# Patient Record
Sex: Male | Born: 1960 | Race: White | Hispanic: No | Marital: Single | Smoking: Current every day smoker
Health system: Southern US, Community
[De-identification: ages and names within clinical notes are randomized; demographics above are authoritative.]

## PROBLEM LIST (undated history)

## (undated) DIAGNOSIS — I10 Essential (primary) hypertension: Secondary | ICD-10-CM

## (undated) HISTORY — PX: HAND SURGERY: SHX662

## (undated) HISTORY — PX: SHOULDER SURGERY: SHX246

## (undated) HISTORY — PX: HERNIA REPAIR: SHX51

---

## 2002-05-21 ENCOUNTER — Emergency Department (HOSPITAL_COMMUNITY): Admission: EM | Admit: 2002-05-21 | Discharge: 2002-05-21 | Payer: Self-pay | Admitting: Emergency Medicine

## 2008-10-19 ENCOUNTER — Emergency Department (HOSPITAL_COMMUNITY): Admission: EM | Admit: 2008-10-19 | Discharge: 2008-10-19 | Payer: Self-pay | Admitting: Emergency Medicine

## 2008-10-28 ENCOUNTER — Ambulatory Visit (HOSPITAL_BASED_OUTPATIENT_CLINIC_OR_DEPARTMENT_OTHER): Admission: RE | Admit: 2008-10-28 | Discharge: 2008-10-28 | Payer: Self-pay | Admitting: Orthopedic Surgery

## 2009-09-14 ENCOUNTER — Emergency Department (HOSPITAL_COMMUNITY): Admission: EM | Admit: 2009-09-14 | Discharge: 2009-09-14 | Payer: Self-pay | Admitting: Emergency Medicine

## 2010-04-08 ENCOUNTER — Emergency Department (HOSPITAL_COMMUNITY): Admission: EM | Admit: 2010-04-08 | Discharge: 2010-04-08 | Payer: Self-pay | Admitting: Emergency Medicine

## 2010-11-02 LAB — POCT HEMOGLOBIN-HEMACUE: Hemoglobin: 14.9 g/dL (ref 13.0–17.0)

## 2010-12-06 NOTE — Op Note (Signed)
Roger Rich, Roger Rich                ACCOUNT NO.:  [id number]   MEDICAL RECORD NO.:  [id number]          PATIENT TYPE:  AMB   LOCATION:  DSC                          FACILITY:  [facility]   PHYSICIAN:  Artist Pais. Weingold, M.D.DATE OF BIRTH:  [birth year]   DATE OF PROCEDURE:  10/28/2008  DATE OF DISCHARGE:                               OPERATIVE REPORT   PREOPERATIVE DIAGNOSIS:  Displaced fifth metacarpal head neck fracture.   POSTOPERATIVE DIAGNOSIS:  Displaced fifth metacarpal head neck fracture.   PROCEDURE:  Open reduction and internal fixation of above using 0.045 K-  wires x2.   SURGEON:  Artist Pais. Mina Marble, MD   ASSISTANT:  None.   ANESTHESIA:  General.   TOURNIQUET TIME:  28 minutes.   COMPLICATIONS:  None.   DRAINS:  None.   OPERATIVE REPORT:  The patient taken to the operating suite after the  induction of adequate general anesthesia.  Right upper extremity was  prepped and draped in the usual sterile fashion.  An Esmarch was used to  exsanguinate the limb.  Tourniquet was then inflated to 250 mmHg.  At  this point in time, longitudinal traction and then a Jahss maneuver was  used to reduce the displaced fifth metacarpal head neck junction  fracture.  Once this was done, a small incision was made just on the  lateral aspect of the head neck junction.  A towel clamp was then used  to help maintain reduction and two 0.045 K-wires were driven from ulnar  to radial across the fracture site and into the head neck area of the  fourth metacarpal to provide stabilization.  Intraoperative fluoroscopy  revealed good reduction both in the AP, lateral, and oblique view.  The  K-wire was cut outside the skin, bent upon themselves.  The patient was  then placed in sterile dressing, Xeroform, 4 x 4s fluffs, and ulnar  gutter splint.  The patient tolerated the procedure well and went to the  recovery room in stable fashion.      Artist Pais Mina Marble, M.D.  Electronically  Signed     MAW/MEDQ  D:  10/28/2008  T:  10/29/2008  Job:  [id number]

## 2014-10-28 ENCOUNTER — Encounter (HOSPITAL_COMMUNITY): Payer: Self-pay | Admitting: *Deleted

## 2014-10-28 ENCOUNTER — Emergency Department (HOSPITAL_COMMUNITY)

## 2014-10-28 ENCOUNTER — Emergency Department (HOSPITAL_COMMUNITY)
Admission: EM | Admit: 2014-10-28 | Discharge: 2014-10-28 | Disposition: A | Discharge status: Home / Self Care | Attending: Emergency Medicine | Admitting: Emergency Medicine

## 2014-10-28 DIAGNOSIS — I10 Essential (primary) hypertension: Secondary | ICD-10-CM | POA: Diagnosis not present

## 2014-10-28 DIAGNOSIS — Z79899 Other long term (current) drug therapy: Secondary | ICD-10-CM | POA: Diagnosis not present

## 2014-10-28 DIAGNOSIS — Z720 Tobacco use: Secondary | ICD-10-CM | POA: Insufficient documentation

## 2014-10-28 DIAGNOSIS — R002 Palpitations: Secondary | ICD-10-CM | POA: Diagnosis not present

## 2014-10-28 HISTORY — DX: Essential (primary) hypertension: I10

## 2014-10-28 LAB — BASIC METABOLIC PANEL WITH GFR
Anion gap: 9 (ref 5–15)
BUN: 10 mg/dL (ref 6–23)
CO2: 25 mmol/L (ref 19–32)
Calcium: 8.4 mg/dL (ref 8.4–10.5)
Chloride: 103 mmol/L (ref 96–112)
Creatinine, Ser: 0.79 mg/dL (ref 0.50–1.35)
GFR calc Af Amer: >90 mL/min
GFR calc non Af Amer: >90 mL/min
Glucose, Bld: 102 mg/dL — ABNORMAL HIGH (ref 70–99)
Potassium: 3.8 mmol/L (ref 3.5–5.1)
Sodium: 137 mmol/L (ref 135–145)

## 2014-10-28 LAB — CBC
HCT: 45 % (ref 39.0–52.0)
Hemoglobin: 15.1 g/dL (ref 13.0–17.0)
MCH: 32.2 pg (ref 26.0–34.0)
MCHC: 33.6 g/dL (ref 30.0–36.0)
MCV: 95.9 fL (ref 78.0–100.0)
Platelets: 212 K/uL (ref 150–400)
RBC: 4.69 MIL/uL (ref 4.22–5.81)
RDW: 13.4 % (ref 11.5–15.5)
WBC: 3.8 K/uL — ABNORMAL LOW (ref 4.0–10.5)

## 2014-10-28 LAB — TSH: TSH: 1.107 u[IU]/mL (ref 0.350–4.500)

## 2014-10-28 NOTE — Discharge Instructions (Signed)
Return to the ED with any concerns including chest pain, fainting, difficulty breathing, decreased level of alertness/lethargy, or any other alarming symptoms  You should be sure to arrange for followup with cardiology, if symptoms continue you will need to wear a Holter Monitor

## 2014-10-28 NOTE — ED Notes (Signed)
Approved by MD Linker for patient to take Losartan (his home medication for BP).

## 2014-10-28 NOTE — ED Provider Notes (Signed)
CSN: [id number]     Arrival date & time 10/28/14  0725 History   First MD Initiated Contact with Patient 10/28/14 0730     Chief Complaint  Patient presents with  . Palpitations     (Consider location/radiation/quality/duration/timing/severity/associated sxs/prior Treatment) HPI  Pt presenting with c/o feeling heart racing and palpitations.  Symptoms have been intermittent over the past 3 days.  This morning symptoms recurred which prompted ED evaluation. No chest pain, no fainting.  No dizziness.  He states he has not been drinking more  Caffeine, no supplements or energy drinks.  He cannot correlate what causes his symptoms or what makes them better.  There are no other associated systemic symptoms, there are no other alleviating or modifying factors.   Past Medical History  Diagnosis Date  . Hypertension    Past Surgical History  Procedure Laterality Date  . Shoulder surgery      right shoulder  . Hand surgery      right hand  . Hernia repair     History reviewed. No pertinent family history. History  Substance Use Topics  . Smoking status: Current Every Day Smoker -- 0.50 packs/day for 30 years    Types: Cigarettes  . Smokeless tobacco: Not on file  . Alcohol Use: Yes     Comment: weekly    Review of Systems  ROS reviewed and all otherwise negative except for mentioned in HPI    Allergies  Review of patient's allergies indicates no known allergies.  Home Medications   Prior to Admission medications   Medication Sig Start Date End Date Taking? Authorizing Provider  hydrochlorothiazide (HYDRODIURIL) 25 MG tablet Take 25 mg by mouth daily.   Yes Historical Provider, MD  ibuprofen (ADVIL,MOTRIN) 200 MG tablet Take 400 mg by mouth every 6 (six) hours as needed for mild pain or moderate pain.   Yes Historical Provider, MD  losartan (COZAAR) 50 MG tablet Take 50 mg by mouth daily.   Yes Historical Provider, MD   BP 134/89 mmHg  Pulse 64  Temp(Src) 98.2 F (36.8 C)  (Oral)  Resp 16  SpO2 96%  Vitals reviewed Physical Exam  Physical Examination: General appearance - alert, well appearing, and in no distress Mental status - alert, oriented to person, place, and time Eyes - no conjunctival injection, no scleral icterus Mouth - mucous membranes moist, pharynx normal without lesions Chest - clear to auscultation, no wheezes, rales or rhonchi, symmetric air entry Heart - normal rate, regular rhythm, normal S1, S2, no murmurs, rubs, clicks or gallops Abdomen - soft, nontender, nondistended, no masses or organomegaly Extremities - peripheral pulses normal, no pedal edema, no clubbing or cyanosis Skin - normal coloration and turgor, no rashes  ED Course  Procedures (including critical care time) Labs Review Labs Reviewed  CBC - Abnormal; Notable for the following:    WBC 3.8 (*)    All other components within normal limits  BASIC METABOLIC PANEL - Abnormal; Notable for the following:    Glucose, Bld 102 (*)    All other components within normal limits  TSH    Imaging Review Dg Chest 2 View  10/28/2014   CLINICAL DATA:  DN cardiac palpitations with tachycardia for 3 days. Cough  EXAM: CHEST  2 VIEW  COMPARISON:  None.  FINDINGS: Lungs are clear. Heart size and pulmonary vascularity are normal. No adenopathy. There is an old healed fracture of the right clavicle. There is postoperative change in the right shoulder region.  IMPRESSION: No edema or consolidation.   Electronically Signed   By: Bretta BangWilliam  Woodruff III M.D.   On: 10/28/2014 08:13     EKG Interpretation   Date/Time:  Wednesday October 28 2014 07:35:57 EDT Ventricular Rate:  81 PR Interval:  142 QRS Duration: 95 QT Interval:  393 QTC Calculation: 456 R Axis:   82 Text Interpretation:  Sinus rhythm Anteroseptal infarct, old Baseline  wander in lead(s) aVR V6 No significant change since last tracing  Confirmed by [facility]  MD, MARTHA [id number]) on 10/28/2014 7:41:57 AM      MDM   Final  diagnoses:  Palpitations    Pt ptresenting with sensation of palpitations.  He deneis chest pain, but feels heart racing at times over the past few days.  Labs reassuring including tsh.  EKg reassuring.  No paplitations or elevated heart rate here.  D/w patient about followup for holter monitor.  Discharged with strict return precautions.  Pt agreeable with plan.    Jerelyn ScottMartha Linker, MD 10/28/14 1010

## 2014-10-28 NOTE — ED Notes (Signed)
Pt reports feeling like intermittent heart racing for several days. Denies pain. Reports heart racing feeling at rest, no correlation to what is causing the racing feeling. Denies increase caffeine use or supplements/stimulatns.

## 2014-10-28 NOTE — ED Notes (Signed)
Pt alert, oriented, and ambulatory upon DC. Pt was advised to follow up with cardiology in 2-3 days.

## 2014-10-30 ENCOUNTER — Encounter: Payer: Self-pay | Admitting: Cardiovascular Disease

## 2014-10-30 ENCOUNTER — Ambulatory Visit (INDEPENDENT_AMBULATORY_CARE_PROVIDER_SITE_OTHER): Admitting: Cardiovascular Disease

## 2014-10-30 VITALS — BP 152/90 | HR 95 | Ht 68.0 in | Wt 146.0 lb

## 2014-10-30 DIAGNOSIS — R002 Palpitations: Secondary | ICD-10-CM

## 2014-10-30 MED ORDER — METOPROLOL SUCCINATE ER 50 MG PO TB24: 50.0000 mg | ORAL_TABLET | Freq: Every day | ORAL | Status: AC

## 2014-10-30 NOTE — Progress Notes (Signed)
Patient ID: Roger Rich, male   DOB: 08/20/1960, 54 y.o.   MRN: [id number]     Cardiology Office Note   Date:  10/30/2014   ID:  Roger Rich, DOB 01/26/1961, MRN [id number]  PCP:  No primary care provider on file.  Cardiologist:   Charlton HawsPeter Judea Fennimore, MD   No chief complaint on file.     History of Present Illness: Roger Rich Roger Rich is a 54 y.o. male who presents for palpitations Seen in ER 4/6  Telemetry normal  Labs reviewed and normal including Hct and TSH  (mild neutropenia)   Doesn't really have primary  Started on BP meds last march.  Has not taken last couple days.  Smokes  Counseled less than 10 minutes not much motivation to quit Felt heart beating out of his chest Not related to exertions.  No excess caffeine, stimulants  Works at [employer] No syncope chest pain or dyspnea  Noted last few weeks not daily but weekly  Rapid beats lasting minutes.  Nothing improves with onset.       Past Medical History  Diagnosis Date  . Hypertension     Past Surgical History  Procedure Laterality Date  . Shoulder surgery      right shoulder  . Hand surgery      right hand  . Hernia repair       Current Outpatient Prescriptions  Medication Sig Dispense Refill  . hydrochlorothiazide (HYDRODIURIL) 25 MG tablet Take 25 mg by mouth daily.    Marland Kitchen. ibuprofen (ADVIL,MOTRIN) 200 MG tablet Take 400 mg by mouth every 6 (six) hours as needed for mild pain or moderate pain.    Marland Kitchen. losartan (COZAAR) 50 MG tablet Take 50 mg by mouth daily.     No current facility-administered medications for this visit.    Allergies:   Review of patient's allergies indicates no known allergies.    Social History:  The patient  reports that he has been smoking Cigarettes.  He has a 15 pack-year smoking history. He does not have any smokeless tobacco history on file. He reports that he drinks alcohol. He reports that he does not use illicit drugs.   Family History:  The patient's family history is not on file.     ROS:  Please see the history of present illness.   Otherwise, review of systems are positive for none.   All other systems are reviewed and negative.    PHYSICAL EXAM: VS:  There were no vitals taken for this visit. , BMI There is no height or weight on file to calculate BMI. GEN: Well nourished, well developed, in no acute distress HEENT: normal Neck: no JVD, carotid bruits, or masses Cardiac:  RRR; no murmurs, rubs, or gallops,no edema  Respiratory:  clear to auscultation bilaterally, normal work of breathing GI: soft, nontender, nondistended, + BS MS: no deformity or atrophy Skin: warm and dry, no rash Neuro:  Strength and sensation are intact Psych: euthymic mood, full affect   EKG:   SR rate 81 normal    Recent Labs: 10/28/2014: BUN 10; Creatinine 0.79; Hemoglobin 15.1; Platelets 212; Potassium 3.8; Sodium 137; TSH 1.107    Lipid Panel No results found for: CHOL, TRIG, HDL, CHOLHDL, VLDL, LDLCALC, LDLDIRECT    Wt Readings from Last 3 Encounters:  No data found for Wt      Other studies Reviewed: Additional studies/ records that were reviewed today include:  .    ASSESSMENT AND PLAN:  1.  Palpitations:  Add toprol to take in am f/u event monitor Labs ok 2. HTN:  Suboptimally controlled beta blocker in am , ARB afternoon and diuretic pm   Current medicines are reviewed at length with the patient today.  The patient does not have concerns regarding medicines.  The following changes have been made:  Toprol 50 mg added  Labs/ tests ordered today include: Event monitor  No orders of the defined types were placed in this encounter.     Disposition:   FU with post monitor    i    Signed, Charlton Haws, MD  10/30/2014 8:29 AM    [facility] [address] [place], [place], [place]  [address] Phone: [phone]; Fax: [phone]

## 2014-10-30 NOTE — Patient Instructions (Signed)
Your physician recommends that you schedule a follow-up appointment in: 4 WEEKS  WITH DR Eden EmmsNISHAN  AFTER  MONITOR DONE  Your physician has recommended you make the following change in your medication:  START METOPROLOL 50 MG  EVERY AM  Your physician has recommended that you wear an event monitor. Event monitors are medical devices that record the heart's electrical activity. Doctors most often us these monitors to diagnose arrhythmias. Arrhythmias are problems with the speed or rhythm of the heartbeat. The monitor is a small, portable device. You can wear one while you do your normal daily activities. This is usually used to diagnose what is causing palpitations/syncope (passing out).  21 DAY  EVENT

## 2014-11-04 ENCOUNTER — Encounter: Payer: Self-pay | Admitting: *Deleted

## 2014-11-04 ENCOUNTER — Encounter (INDEPENDENT_AMBULATORY_CARE_PROVIDER_SITE_OTHER)

## 2014-11-04 DIAGNOSIS — R002 Palpitations: Secondary | ICD-10-CM

## 2014-11-04 NOTE — Progress Notes (Signed)
Patient ID: Roger Rich, male   DOB: 10/03/1960, 54 y.o.   MRN: [id number] Prevent[employer] verite 21 day cardiac event monitor applied to patient.

## 2014-12-07 ENCOUNTER — Ambulatory Visit: Admitting: Cardiovascular Disease

## 2014-12-09 NOTE — Progress Notes (Signed)
Patient ID: Roger Rich, male   DOB: 05/02/1961, 54 y.o.   MRN: [id number]     Cardiology Office Note   Date:  12/10/2014   ID:  Roger Rich, DOB 02/16/1961, MRN [id number]  PCP:  No primary care provider on file.  Cardiologist:   Charlton HawsPeter Burna Atlas, MD   Chief Complaint  Patient presents with  . Follow-up    Palpitations      History of Present Illness: Roger Rich is a 54 y.o. male who presents for palpitations Seen in ER 4/6  Telemetry normal  Labs reviewed and normal including Hct and TSH  (mild neutropenia)   Doesn't really have primary  Started on BP meds last march.  Has not taken last couple days.  Smokes  Counseled less than 10 minutes not much motivation to quit Felt heart beating out of his chest Not related to exertions.  No excess caffeine, stimulants  Works at [employer] No syncope chest pain or dyspnea  Noted last few weeks not daily but weekly  Rapid beats lasting minutes.  Nothing improves with onset.   4/16  Event monitor given: reviewed no significant arrhythmia   Added toprol with ARB and diuretic for BP control   Not compliant Seems to be drinking some and had alcohol on breath in exam room today Denies daily ETOH  Work shifts changing going to [employer] at times and he indicates this is why med compliance hard     Past Medical History  Diagnosis Date  . Hypertension     Past Surgical History  Procedure Laterality Date  . Shoulder surgery      right shoulder  . Hand surgery      right hand  . Hernia repair       Current Outpatient Prescriptions  Medication Sig Dispense Refill  . hydrochlorothiazide (HYDRODIURIL) 25 MG tablet Take 25 mg by mouth daily.    Marland Kitchen. ibuprofen (ADVIL,MOTRIN) 200 MG tablet Take 400 mg by mouth every 6 (six) hours as needed for mild pain or moderate pain.    Marland Kitchen. losartan (COZAAR) 50 MG tablet Take 50 mg by mouth daily.    . metoprolol succinate (TOPROL-XL) 50 MG 24 hr tablet Take 1 tablet (50 mg total) by mouth  daily. Take with or immediately following a meal. 90 tablet 3   No current facility-administered medications for this visit.    Allergies:   Review of patient's allergies indicates no known allergies.    Social History:  The patient  reports that he has been smoking Cigarettes.  He has a 15 pack-year smoking history. He does not have any smokeless tobacco history on file. He reports that he drinks alcohol. He reports that he does not use illicit drugs.   Family History:  The patient's family history includes Heart attack in his father.    ROS:  Please see the history of present illness.   Otherwise, review of systems are positive for none.   All other systems are reviewed and negative.    PHYSICAL EXAM: VS:  BP 140/92 mmHg  Pulse 84  Ht 5\' 8"  (1.727 m)  Wt 65.318 kg (144 lb)  BMI 21.90 kg/m2  SpO2 98% , BMI Body mass index is 21.9 kg/(m^2). GEN: Well nourished, well developed, in no acute distress HEENT: normal Neck: no JVD, carotid bruits, or masses Cardiac:  RRR; no murmurs, rubs, or gallops,no edema  Respiratory:  clear to auscultation bilaterally, normal work of breathing GI:  soft, nontender, nondistended, + BS MS: no deformity or atrophy Skin: warm and dry, no rash Neuro:  Strength and sensation are intact Psych: euthymic mood, full affect   EKG:    10/30/14  SR rate 81 normal    Recent Labs: 10/28/2014: BUN 10; Creatinine 0.79; Hemoglobin 15.1; Platelets 212; Potassium 3.8; Sodium 137; TSH 1.107    Lipid Panel No results found for: CHOL, TRIG, HDL, CHOLHDL, VLDL, LDLCALC, LDLDIRECT    Wt Readings from Last 3 Encounters:  12/10/14 65.318 kg (144 lb)  10/30/14 66.225 kg (146 lb)      Other studies Reviewed: Additional studies/ records that were reviewed today include:  .    ASSESSMENT AND PLAN:  1.  Palpitations:  Improved with toprol monitor benign observe 2. HTN:  Should be fine when compliant  Told him not to take HCTZ on nights he is drinking ETOH a lot   3. ETOH cautioned him on excess regarding making BP hard to control and chronic liver disease   Current medicines are reviewed at length with the patient today.  The patient does not have concerns regarding medicines.  The following changes have been made:  None   Labs/ tests ordered today include: None   No orders of the defined types were placed in this encounter.     Disposition:   FU with me 3 months     Signed, Charlton HawsPeter Mariaelena Cade, MD  12/10/2014 9:31 AM    [facility] [address], [place], [place]  [address] Phone: [phone]; Fax: [phone]

## 2014-12-10 ENCOUNTER — Ambulatory Visit (INDEPENDENT_AMBULATORY_CARE_PROVIDER_SITE_OTHER): Admitting: Cardiovascular Disease

## 2014-12-10 ENCOUNTER — Encounter: Payer: Self-pay | Admitting: Cardiovascular Disease

## 2014-12-10 ENCOUNTER — Ambulatory Visit: Admitting: Cardiovascular Disease

## 2014-12-10 VITALS — BP 140/92 | HR 84 | Ht 68.0 in | Wt 144.0 lb

## 2014-12-10 DIAGNOSIS — I10 Essential (primary) hypertension: Secondary | ICD-10-CM | POA: Diagnosis not present

## 2014-12-10 NOTE — Patient Instructions (Signed)
Medication Instructions:  NO CHANGES  Labwork: NONE  Testing/Procedures: NONE  Follow-Up: Your physician recommends that you schedule a follow-up appointment in:  3 MONTHS WITH DR NISHAN  Any Other Special Instructions Will Be Listed Below (If Applicable).   

## 2015-03-12 ENCOUNTER — Ambulatory Visit: Admitting: Cardiovascular Disease

## 2015-03-23 ENCOUNTER — Ambulatory Visit: Admitting: Cardiovascular Disease

## 2016-12-22 IMAGING — CR DG CHEST 2V
2 series · 2 of 2 positions shown · non-contrast
Comparison: None.

CLINICAL DATA: DN cardiac palpitations with tachycardia for 3 days.
Cough

EXAM:
CHEST  2 VIEW

[w chest pa]
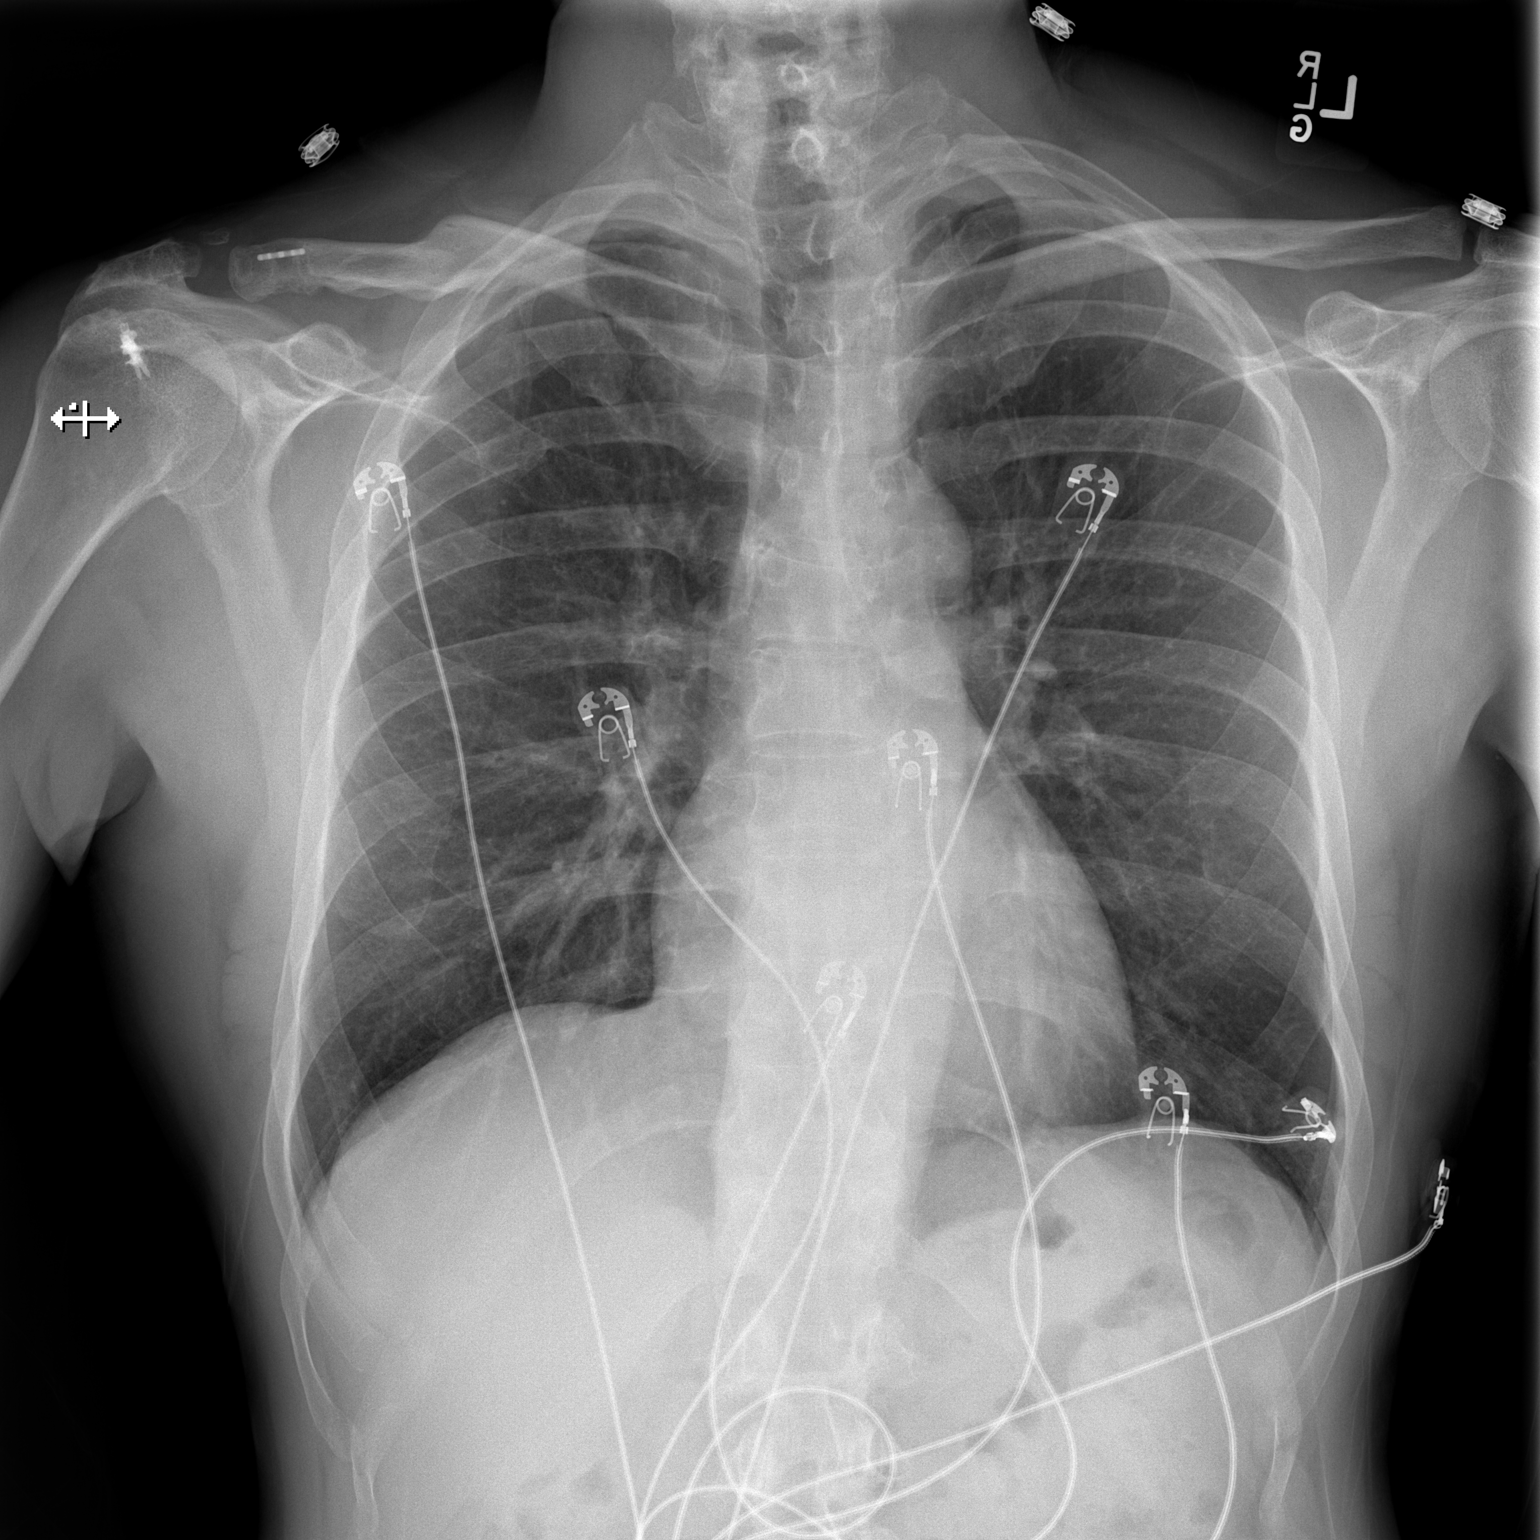

[w chest lat]
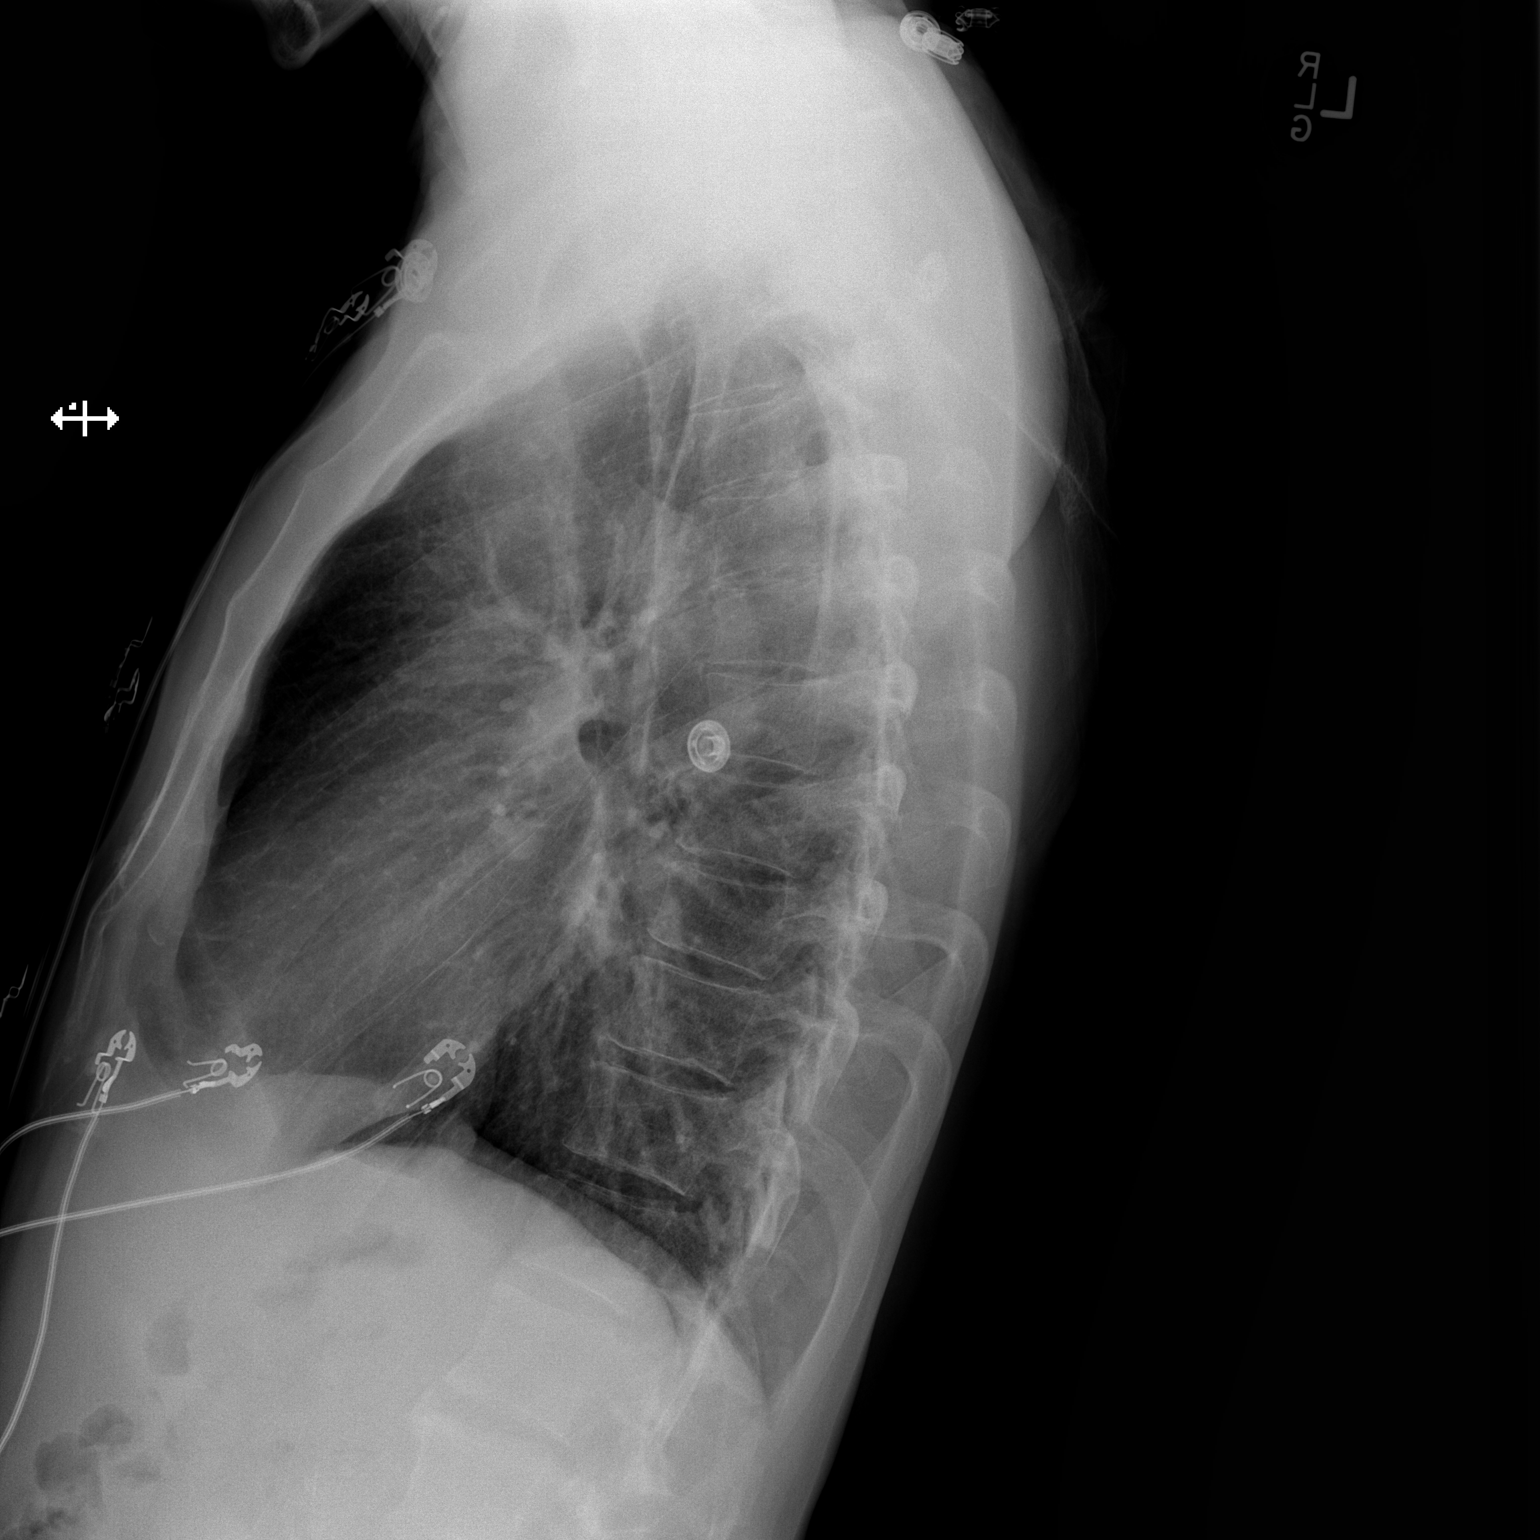

[2 of 2 positions shown; findings below may reference images not displayed]

FINDINGS: Lungs are clear. Heart size and pulmonary vascularity are normal. No
adenopathy. There is an old healed fracture of the right clavicle.
There is postoperative change in the right shoulder region.
IMPRESSION: No edema or consolidation.

## 2018-01-28 ENCOUNTER — Other Ambulatory Visit: Payer: Self-pay | Admitting: Family Medicine

## 2018-01-28 DIAGNOSIS — F17200 Nicotine dependence, unspecified, uncomplicated: Secondary | ICD-10-CM

## 2018-02-08 ENCOUNTER — Ambulatory Visit

## 2021-10-03 ENCOUNTER — Emergency Department (HOSPITAL_COMMUNITY)
Admission: EM | Admit: 2021-10-03 | Discharge: 2021-10-03 | Disposition: A | Discharge status: Home / Self Care | Attending: Physician Assistant | Admitting: Physician Assistant

## 2021-10-03 ENCOUNTER — Encounter (HOSPITAL_COMMUNITY): Payer: Self-pay | Admitting: Emergency Medicine

## 2021-10-03 ENCOUNTER — Other Ambulatory Visit: Payer: Self-pay

## 2021-10-03 DIAGNOSIS — I10 Essential (primary) hypertension: Secondary | ICD-10-CM | POA: Diagnosis not present

## 2021-10-03 DIAGNOSIS — M79662 Pain in left lower leg: Secondary | ICD-10-CM | POA: Insufficient documentation

## 2021-10-03 DIAGNOSIS — Z79899 Other long term (current) drug therapy: Secondary | ICD-10-CM | POA: Diagnosis not present

## 2021-10-03 DIAGNOSIS — F17210 Nicotine dependence, cigarettes, uncomplicated: Secondary | ICD-10-CM | POA: Insufficient documentation

## 2021-10-03 DIAGNOSIS — R52 Pain, unspecified: Secondary | ICD-10-CM

## 2021-10-03 NOTE — Discharge Instructions (Addendum)
IMPORTANT PATIENT INSTRUCTIONS:  You have been scheduled for an Outpatient Vascular Study at [facility].    If tomorrow is a Saturday, Sunday or holiday, please go to the [facility] Registration Desk at 11 am tomorrow morning and tell them you are there for a vascular study.   If tomorrow is a weekday (Monday-Friday), please go to [facility] Registration at 11 am and tell them you are there for a vascular study. 

## 2021-10-03 NOTE — ED Provider Notes (Cosign Needed)
[facility] EMERGENCY DEPARTMENT Provider Note   CSN: [id number] Arrival date & time: 10/03/21  2244     History  Chief Complaint  Patient presents with   Calf Pain     Roger Rich is a 61 y.o. male.  HPI 61 year old male with a history of hypertension and cigarette smoking presents to the ER with complaints of left-sided calf pain which began about a week ago.  He states he had some intermittent right elbow pain for several weeks prior to this, and developed calf pain which has been hindering his work.  He denies any falls or injuries.  He states he started googling his symptoms and read that he may have a blood clot.  He states that his mother had a history of a DVT and his father had a heart attack.  He denies any chest pain or shortness of breath.  He denies any significant swelling.  He has been wearing compression socks for pain control.  Denies any fevers or chills.  He denies any recent travel or surgeries     Home Medications Prior to Admission medications   Medication Sig Start Date End Date Taking? Authorizing Provider  hydrochlorothiazide (HYDRODIURIL) 25 MG tablet Take 25 mg by mouth daily.    [provider]  ibuprofen (ADVIL,MOTRIN) 200 MG tablet Take 400 mg by mouth every 6 (six) hours as needed for mild pain or moderate pain.    [provider]  losartan (COZAAR) 50 MG tablet Take 50 mg by mouth daily.    [provider]  metoprolol succinate (TOPROL-XL) 50 MG 24 hr tablet Take 1 tablet (50 mg total) by mouth daily. Take with or immediately following a meal. 10/30/14   Wendall Stade, MD      Allergies    Patient has no known allergies.    Review of Systems   Review of Systems Ten systems reviewed and are negative for acute change, except as noted in the HPI.   Physical Exam Updated Vital Signs BP 127/76 (BP Location: Right Arm)    Pulse 66    Temp 98.2 F (36.8 C) (Oral)    Resp 19    Ht 5\' 8"  (1.727 m)    Wt 70  kg    SpO2 99%    BMI 23.46 kg/m  Physical Exam Vitals and nursing note reviewed.  Constitutional:      General: He is not in acute distress.    Appearance: He is well-developed.  HENT:     Head: Normocephalic and atraumatic.  Eyes:     Conjunctiva/sclera: Conjunctivae normal.  Cardiovascular:     Rate and Rhythm: Normal rate and regular rhythm.     Heart sounds: No murmur heard. Pulmonary:     Effort: Pulmonary effort is normal. No respiratory distress.     Breath sounds: Normal breath sounds.  Abdominal:     Palpations: Abdomen is soft.     Tenderness: There is no abdominal tenderness.  Musculoskeletal:        General: No swelling.     Cervical back: Neck supple.     Comments: Minimal left calf tenderness to palpation.  No significant warmth or swelling.  2+ DP pulses.  No popliteal tenderness.  5/5 plantarflexion and dorsiflexion of the left ankle.  Skin:    General: Skin is warm and dry.     Capillary Refill: Capillary refill takes less than 2 seconds.  Neurological:     Mental Status: He  is alert.  Psychiatric:        Mood and Affect: Mood normal.    ED Results / Procedures / Treatments   Labs (all labs ordered are listed, but only abnormal results are displayed) Labs Reviewed - No data to display  EKG None  Radiology No results found.  Procedures Procedures    Medications Ordered in ED Medications - No data to display  ED Course/ Medical Decision Making/ A&P                           Medical Decision Making  61 year old male presented to the ER with complaints of left calf pain and concern for DVT.  On arrival, he is well appearing.  No tachycardia or tachypnea.  Physical exam largely unremarkable, no significant warmth or swelling, minimal calf tenderness.  No popliteal tenderness.  Full range of motion of the left ankle.  Low suspicion for DVT at this time however given his family history we will order a outpatient DVT since ultrasound services are not  available at this time of night.  Low suspicion for PE. No signs of cellulitis. Low suspicion for achilles injury. He was given directions on when and where to return.  We discussed return precautions.  He voiced understanding and is agreeable.  Stable for discharge.        Final Clinical Impression(s) / ED Diagnoses Final diagnoses:  Pain of left calf    Rx / DC Orders ED Discharge Orders          Ordered    UE VENOUS DUPLEX        10/03/21 2312              Mare Ferrari, PA-C 10/03/21 2318

## 2021-10-03 NOTE — ED Triage Notes (Signed)
Patient reports left calf pain onset this week , denies injury , respirations unlabored, patient added intermittent pain at right elbow for several weeks .  ?

## 2021-10-04 ENCOUNTER — Ambulatory Visit (HOSPITAL_COMMUNITY)
Admission: RE | Admit: 2021-10-04 | Discharge: 2021-10-04 | Disposition: A | Discharge status: Home / Self Care | Source: Ambulatory Visit | Attending: Physician Assistant | Admitting: Physician Assistant

## 2021-10-04 DIAGNOSIS — R52 Pain, unspecified: Secondary | ICD-10-CM | POA: Insufficient documentation

## 2021-10-04 NOTE — Progress Notes (Signed)
Lower extremity venous has been completed.  ? ?Preliminary results in CV Proc.  ? ?Roger Rich Skyeler Scalese ?10/04/2021 11:15 AM    ?
